# Patient Record
Sex: Male | Born: 1995 | Race: Black or African American | Hispanic: No | Marital: Married
Health system: Southern US, Community
[De-identification: ages and names within clinical notes are randomized; demographics above are authoritative.]

---
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
## 2018-09-14 ENCOUNTER — Other Ambulatory Visit: Payer: Self-pay | Admitting: *Deleted

## 2018-09-14 DIAGNOSIS — Z20822 Contact with and (suspected) exposure to covid-19: Secondary | ICD-10-CM

## 2018-09-15 LAB — NOVEL CORONAVIRUS, NAA: SARS-CoV-2, NAA: NOT DETECTED

## 2018-09-22 ENCOUNTER — Other Ambulatory Visit: Payer: Self-pay

## 2018-09-22 DIAGNOSIS — Z20822 Contact with and (suspected) exposure to covid-19: Secondary | ICD-10-CM

## 2018-09-24 LAB — NOVEL CORONAVIRUS, NAA: SARS-CoV-2, NAA: DETECTED — AB

## 2018-10-13 ENCOUNTER — Other Ambulatory Visit: Payer: Self-pay

## 2018-10-13 DIAGNOSIS — Z20822 Contact with and (suspected) exposure to covid-19: Secondary | ICD-10-CM

## 2018-10-15 LAB — NOVEL CORONAVIRUS, NAA: SARS-CoV-2, NAA: NOT DETECTED

## 2018-12-14 ENCOUNTER — Other Ambulatory Visit: Payer: Self-pay

## 2018-12-14 DIAGNOSIS — Z20822 Contact with and (suspected) exposure to covid-19: Secondary | ICD-10-CM

## 2018-12-16 LAB — NOVEL CORONAVIRUS, NAA: SARS-CoV-2, NAA: NOT DETECTED

## 2019-04-21 ENCOUNTER — Ambulatory Visit: Payer: Self-pay | Attending: Internal Medicine

## 2019-04-21 DIAGNOSIS — Z23 Encounter for immunization: Secondary | ICD-10-CM

## 2019-04-21 NOTE — Progress Notes (Signed)
   Covid-19 Vaccination Clinic  Name:  Donald Fuller    MRN: [id number] DOB: 19-Jun-1995  04/21/2019  [name] was observed post Covid-19 immunization for 15 minutes without incident. He was provided with Vaccine Information Sheet and instruction to access the V-Safe system.   [name] was instructed to call 911 with any severe reactions post vaccine: Marland Kitchen Difficulty breathing  . Swelling of face and throat  . A fast heartbeat  . A bad rash all over body  . Dizziness and weakness   Immunizations Administered    Name Date Dose VIS Date Route   Pfizer COVID-19 Vaccine 04/21/2019  3:03 PM 0.3 mL 12/24/2018 Intramuscular   Manufacturer: [employer], [employer]   Lot: [id number]   NDC: [address]-8

## 2019-05-16 ENCOUNTER — Ambulatory Visit: Payer: Self-pay | Attending: Internal Medicine

## 2019-05-16 DIAGNOSIS — Z23 Encounter for immunization: Secondary | ICD-10-CM

## 2019-05-16 NOTE — Progress Notes (Signed)
   Covid-19 Vaccination Clinic  Name:  Donald Fuller    MRN: [id number] DOB: 1995-05-19  05/16/2019  [name] was observed post Covid-19 immunization for 15 minutes without incident. He was provided with Vaccine Information Sheet and instruction to access the V-Safe system.   [name] was instructed to call 911 with any severe reactions post vaccine: Marland Kitchen Difficulty breathing  . Swelling of face and throat  . A fast heartbeat  . A bad rash all over body  . Dizziness and weakness   Immunizations Administered    Name Date Dose VIS Date Route   Pfizer COVID-19 Vaccine 05/16/2019  2:48 PM 0.3 mL 03/09/2018 Intramuscular   Manufacturer: [employer], [employer]   Lot: [id number]   NDC: [address]-9

## 2019-09-02 ENCOUNTER — Other Ambulatory Visit: Payer: Self-pay | Admitting: Sleep Medicine

## 2019-09-02 ENCOUNTER — Other Ambulatory Visit: Payer: Self-pay

## 2019-09-02 DIAGNOSIS — Z20822 Contact with and (suspected) exposure to covid-19: Secondary | ICD-10-CM

## 2019-09-03 LAB — NOVEL CORONAVIRUS, NAA: SARS-CoV-2, NAA: NOT DETECTED

## 2019-09-03 LAB — SARS-COV-2, NAA 2 DAY TAT

## 2021-03-06 ENCOUNTER — Other Ambulatory Visit: Payer: Self-pay

## 2021-03-06 ENCOUNTER — Ambulatory Visit (INDEPENDENT_AMBULATORY_CARE_PROVIDER_SITE_OTHER): Payer: 59 | Admitting: Primary Care

## 2021-03-06 VITALS — BP 110/70 | HR 94 | Ht 68.0 in | Wt 238.0 lb

## 2021-03-06 DIAGNOSIS — R0683 Snoring: Secondary | ICD-10-CM

## 2021-03-06 NOTE — Progress Notes (Unsigned)
° °@  Patient ID: Donald Fuller, male    DOB: October 13, 1995, 26 y.o.   MRN: [id number]  No chief complaint on file.   Referring provider: Willey Blade, MD  HPI: 26 year old male.   03/06/2021 Patient presents today for sleep consult. Patient has symtpoms of snoring and winessed apnea. Symtpoms have improved with small amount of weight loss. He recalls an episode where he woke up gasping for air. Current weight 238, max weight 250.   Sleep questionnaire Symptoms- Snoring and witnessed apnea  Prior sleep study- None  Bedtime- 9:30pm Time to fall asleep- <20 mins Nocturnal awakenings- None Out of bed/start day- 5:30am Weight changes- +15 lbs Epworth- 5   Not on File  Immunization History  Administered Date(s) Administered   PFIZER(Purple Top)SARS-COV-2 Vaccination 04/21/2019, 05/16/2019    No past medical history on file.  Tobacco History: Social History   Tobacco Use  Smoking Status Not on file  Smokeless Tobacco Not on file   Counseling given: Not Answered   No outpatient medications prior to visit.   No facility-administered medications prior to visit.   Review of Systems  Review of Systems  Constitutional: Negative.   HENT: Negative.    Respiratory: Negative.    Psychiatric/Behavioral:  Positive for sleep disturbance.     Physical Exam  BP 110/70    Pulse 94    Ht 5\' 8"  (1.727 m)    Wt 238 lb (108 kg)    SpO2 95%    BMI 36.19 kg/m  Physical Exam Constitutional:      Appearance: Normal appearance.  HENT:     Head: Normocephalic and atraumatic.     Mouth/Throat:     Mouth: Mucous membranes are moist.     Pharynx: Oropharynx is clear.  Cardiovascular:     Rate and Rhythm: Normal rate and regular rhythm.  Pulmonary:     Effort: Pulmonary effort is normal.     Breath sounds: Normal breath sounds. No wheezing or rhonchi.  Musculoskeletal:        General: Normal range of motion.  Skin:    General: Skin is warm and dry.  Neurological:      General: No focal deficit present.     Mental Status: He is alert and oriented to person, place, and time. Mental status is at baseline.  Psychiatric:        Mood and Affect: Mood normal.        Behavior: Behavior normal.        Thought Content: Thought content normal.        Judgment: Judgment normal.     Lab Results:  CBC    Component Value Date/Time   HGB 15.0 (H) 09/27/2009 2258   HCT 44.0 09/27/2009 2258    BMET    Component Value Date/Time   NA 140 09/27/2009 2258   K 3.6 09/27/2009 2258   CL 104 09/27/2009 2258   GLUCOSE 84 09/27/2009 2258   BUN 19 09/27/2009 2258   CREATININE 1.2 09/27/2009 2258    BNP No results found for: BNP  ProBNP No results found for: PROBNP  Imaging: No results found.   Assessment & Plan:   No problem-specific Assessment & Plan notes found for this encounter.     Martyn Ehrich, NP 03/06/2021

## 2021-03-06 NOTE — Patient Instructions (Addendum)
Risk of untreated sleep apnea include cardiac arrhythmias, stroke, pulm HTN, diabetes  Treatment options include weight loss, oral appliance, CPAP or referral to ENT for possible surgical options   Recommendations: Focus on side sleeping position  Work on weight loss Do not drive if experiencing excessive daytime sleepiness   Orders: Home sleep study re: snoring   Follow-up: 4-6 week [facility] sleep study results and review treatment options    Sleep Apnea Sleep apnea is a condition in which breathing pauses or becomes shallow during sleep. People with sleep apnea usually snore loudly. They may have times when they gasp and stop breathing for 10 seconds or more during sleep. This may happen many times during the night. Sleep apnea disrupts your sleep and keeps your body from getting the rest that it needs. This condition can increase your risk of certain health problems, including: Heart attack. Stroke. Obesity. Type 2 diabetes. Heart failure. Irregular heartbeat. High blood pressure. The goal of treatment is to help you breathe normally again. What are the causes? The most common cause of sleep apnea is a collapsed or blocked airway. There are three kinds of sleep apnea: Obstructive sleep apnea. This kind is caused by a blocked or collapsed airway. Central sleep apnea. This kind happens when the part of the brain that controls breathing does not send the correct signals to the muscles that control breathing. Mixed sleep apnea. This is a combination of obstructive and central sleep apnea. What increases the risk? You are more likely to develop this condition if you: Are overweight. Smoke. Have a smaller than normal airway. Are older. Are male. Drink alcohol. Take sedatives or tranquilizers. Have a family history of sleep apnea. Have a tongue or tonsils that are larger than normal. What are the signs or symptoms? Symptoms of this condition  include: Trouble staying asleep. Loud snoring. Morning headaches. Waking up gasping. Dry mouth or sore throat in the morning. Daytime sleepiness and tiredness. If you have daytime fatigue because of sleep apnea, you may be more likely to have: Trouble concentrating. Forgetfulness. Irritability or mood swings. Personality changes. Feelings of depression. Sexual dysfunction. This may include loss of interest if you are male, or erectile dysfunction if you are male. How is this diagnosed? This condition may be diagnosed with: A medical history. A physical exam. A series of tests that are done while you are sleeping (sleep study). These tests are usually done in a sleep lab, but they may also be done at home. How is this treated? Treatment for this condition aims to restore normal breathing and to ease symptoms during sleep. It may involve managing health issues that can affect breathing, such as high blood pressure or obesity. Treatment may include: Sleeping on your side. Using a decongestant if you have nasal congestion. Avoiding the use of depressants, including alcohol, sedatives, and narcotics. Losing weight if you are overweight. Making changes to your diet. Quitting smoking. Using a device to open your airway while you sleep, such as: An oral appliance. This is a custom-made mouthpiece that shifts your lower jaw forward. A continuous positive airway pressure (CPAP) device. This device blows air through a mask when you breathe out (exhale). A nasal expiratory positive airway pressure (EPAP) device. This device has valves that you put into each nostril. A bi-level positive airway pressure (BIPAP) device. This device blows air through a mask when you breathe in (inhale) and breathe out (exhale). Having surgery if other treatments do  not work. During surgery, excess tissue is removed to create a wider airway. Follow these instructions at home: Lifestyle Make any lifestyle changes  that your health care provider recommends. Eat a healthy, well-balanced diet. Take steps to lose weight if you are overweight. Avoid using depressants, including alcohol, sedatives, and narcotics. Do not use any products that contain nicotine or tobacco. These products include cigarettes, chewing tobacco, and vaping devices, such as e-cigarettes. If you need help quitting, ask your health care provider. General instructions Take over-the-counter and prescription medicines only as told by your health care provider. If you were given a device to open your airway while you sleep, use it only as told by your health care provider. If you are having surgery, make sure to tell your health care provider you have sleep apnea. You may need to bring your device with you. Keep all follow-up visits. This is important. Contact a health care provider if: The device that you received to open your airway during sleep is uncomfortable or does not seem to be working. Your symptoms do not improve. Your symptoms get worse. Get help right away if: You develop: Chest pain. Shortness of breath. Discomfort in your back, arms, or stomach. You have: Trouble speaking. Weakness on one side of your body. Drooping in your face. These symptoms may represent a serious problem that is an emergency. Do not wait to see if the symptoms will go away. Get medical help right away. Call your local emergency services (911 in the [place] Do not drive yourself to the hospital. Summary Sleep apnea is a condition in which breathing pauses or becomes shallow during sleep. The most common cause is a collapsed or blocked airway. The goal of treatment is to restore normal breathing and to ease symptoms during sleep. This information is not intended to replace advice given to you by your health care provider. Make sure you discuss any questions you have with your health care provider. Document Revised: 08/08/2020 Document Reviewed:  12/09/2019 Elsevier Patient Education  2022 [employer].

## 2021-04-18 ENCOUNTER — Ambulatory Visit: Payer: 59

## 2021-04-18 DIAGNOSIS — G4733 Obstructive sleep apnea (adult) (pediatric): Secondary | ICD-10-CM | POA: Diagnosis not present

## 2021-04-18 DIAGNOSIS — R0683 Snoring: Secondary | ICD-10-CM

## 2021-04-29 DIAGNOSIS — G4733 Obstructive sleep apnea (adult) (pediatric): Secondary | ICD-10-CM | POA: Diagnosis not present

## 2021-05-07 ENCOUNTER — Telehealth: Payer: Self-pay | Admitting: Primary Care

## 2021-05-07 DIAGNOSIS — E669 Obesity, unspecified: Secondary | ICD-10-CM | POA: Insufficient documentation

## 2021-05-07 DIAGNOSIS — R0683 Snoring: Secondary | ICD-10-CM | POA: Insufficient documentation

## 2021-05-07 NOTE — Telephone Encounter (Signed)
HST on 04/18/21 showed very mild OSA, AHI 5.3/HR. SpO2 low 89%, average 95%. No treatment is needed at this time. Work on weight loss. Do not drive if tired, focus on side sleeping position. Could consider oral appliance for snoring. Recommend virtual televisit to review results if he has any questions.  ?

## 2021-05-07 NOTE — Assessment & Plan Note (Signed)
-   Patient has symptoms of loud snoring and waking up gasping for air. Epworth 5. Concern patient could have sleep apnea, needs HST to evaluate for OSA. Discussed risk of untreated sleep apnea including cardiac arrhthymias, stroke, pulm HTN and DM. We briefly reviewed treatment options. Encourage continued weight loss efforts and side sleeping position. Advised against driving if experiencing excessive daytime sleepiness. FU in 4-6 weeks to review sleep study results and treatment options if needed.  ?

## 2021-05-08 NOTE — Telephone Encounter (Signed)
Patient is aware is aware of results and voiced his understanding.  ?He did not wish to proceed with oral appliance at this time.  ?Nothing further needed.  ? ?
